# Patient Record
Sex: Female | Born: 1980 | Race: Black or African American | Hispanic: No | Marital: Married | Smoking: Never smoker
Health system: Southern US, Community
[De-identification: ages and names within clinical notes are randomized; demographics above are authoritative.]

## PROBLEM LIST (undated history)

## (undated) DIAGNOSIS — I10 Essential (primary) hypertension: Secondary | ICD-10-CM

## (undated) DIAGNOSIS — K219 Gastro-esophageal reflux disease without esophagitis: Secondary | ICD-10-CM

---

## 2004-03-29 ENCOUNTER — Ambulatory Visit (HOSPITAL_COMMUNITY): Admission: RE | Admit: 2004-03-29 | Discharge: 2004-03-29 | Payer: Self-pay | Admitting: *Deleted

## 2004-06-08 ENCOUNTER — Inpatient Hospital Stay (HOSPITAL_COMMUNITY): Admission: AD | Admit: 2004-06-08 | Discharge: 2004-06-08 | Payer: Self-pay | Admitting: Family Medicine

## 2004-07-05 ENCOUNTER — Ambulatory Visit (HOSPITAL_COMMUNITY): Admission: RE | Admit: 2004-07-05 | Discharge: 2004-07-05 | Payer: Self-pay | Admitting: Gynecology

## 2004-08-20 ENCOUNTER — Inpatient Hospital Stay (HOSPITAL_COMMUNITY): Admission: AD | Admit: 2004-08-20 | Discharge: 2004-08-20 | Payer: Self-pay | Admitting: *Deleted

## 2004-08-21 ENCOUNTER — Inpatient Hospital Stay (HOSPITAL_COMMUNITY): Admission: AD | Admit: 2004-08-21 | Discharge: 2004-08-23 | Payer: Self-pay | Admitting: Gynecology

## 2005-05-19 ENCOUNTER — Emergency Department: Payer: Self-pay | Admitting: Emergency Medicine

## 2006-06-27 ENCOUNTER — Emergency Department: Payer: Self-pay | Admitting: Emergency Medicine

## 2007-06-08 ENCOUNTER — Encounter: Payer: Self-pay | Admitting: Podiatry

## 2007-06-28 ENCOUNTER — Encounter: Payer: Self-pay | Admitting: Podiatry

## 2010-03-08 ENCOUNTER — Emergency Department: Payer: Self-pay | Admitting: Emergency Medicine

## 2011-05-13 IMAGING — CT CT HEAD WITHOUT CONTRAST
2 series · 16 of 30 positions shown, 20 images · non-contrast
Comparison: none

REASON FOR EXAM: headache
COMMENTS:

PROCEDURE:     CT  - CT HEAD WITHOUT CONTRAST  - March 09, 2010  [DATE]
RESULT:     Comparison:  None
TECHNIQUE: Multiple axial images from the foramen magnum to the vertex were
obtained without IV contrast.

[Series 2: without · axial · non-contrast · 0.44mm/px · z∈[-634,-504]mm · 13 of 32 slices shown, 17 images]
[im 3/32  brain]
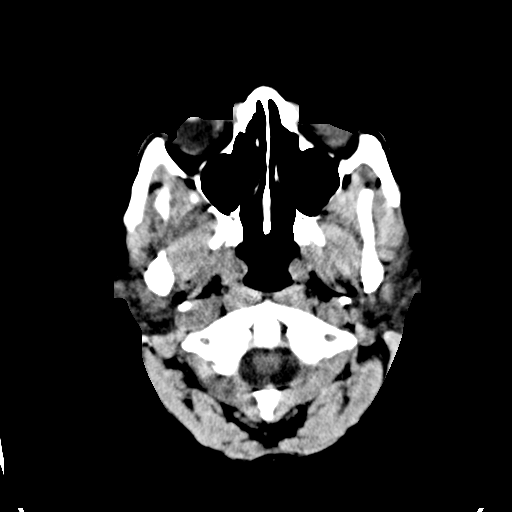
[im 3/32  bone]
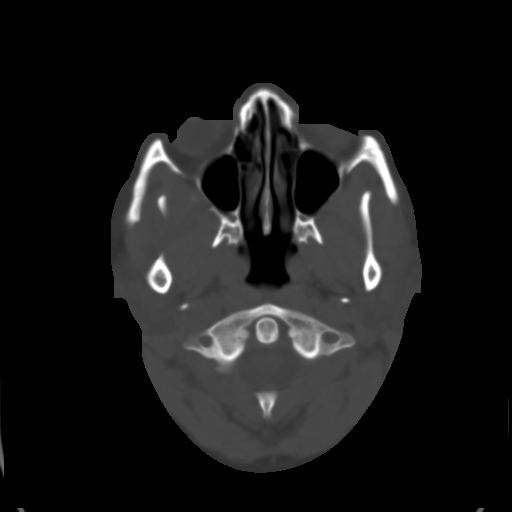
[im 5/32  brain]
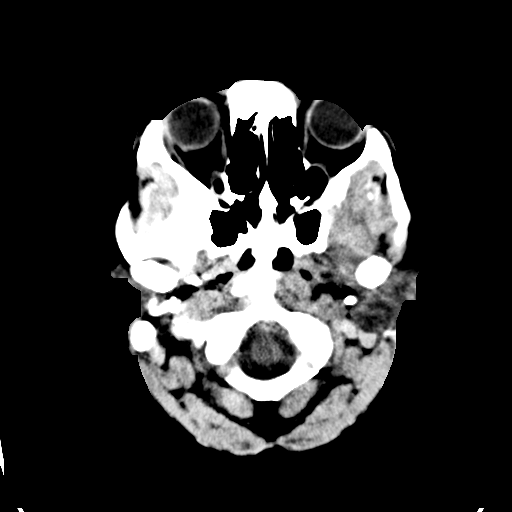
[im 7/32  brain]
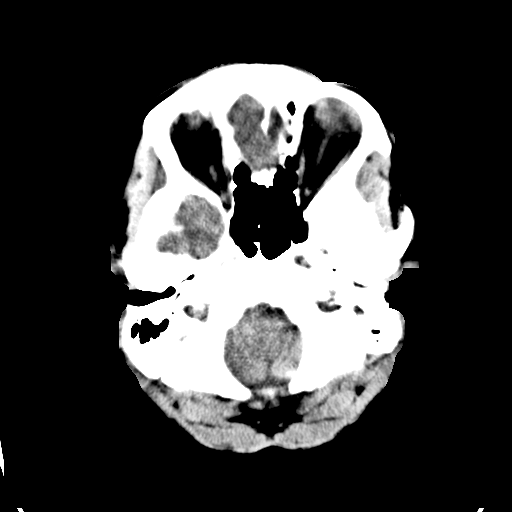
[im 9/32  brain]
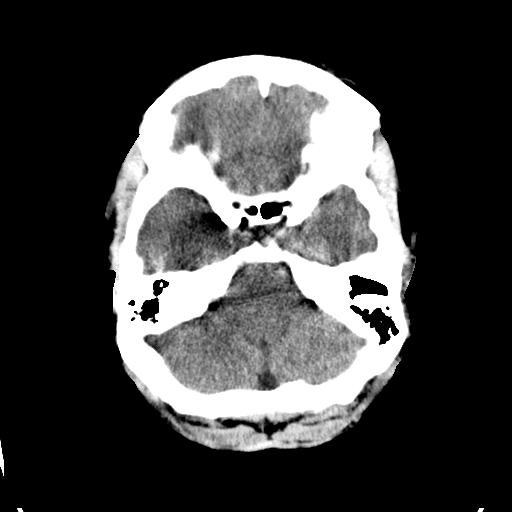
[im 12/32  brain]
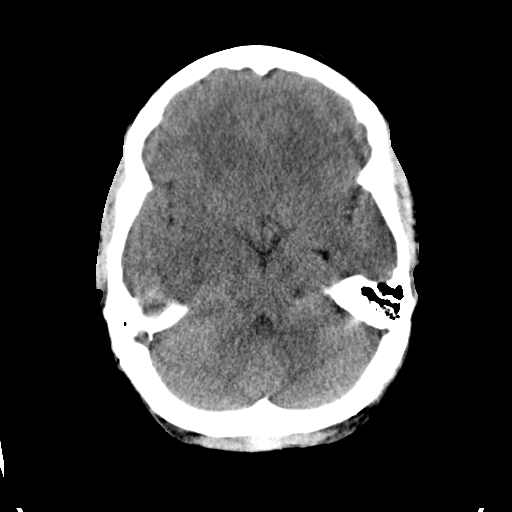
[im 12/32  bone]
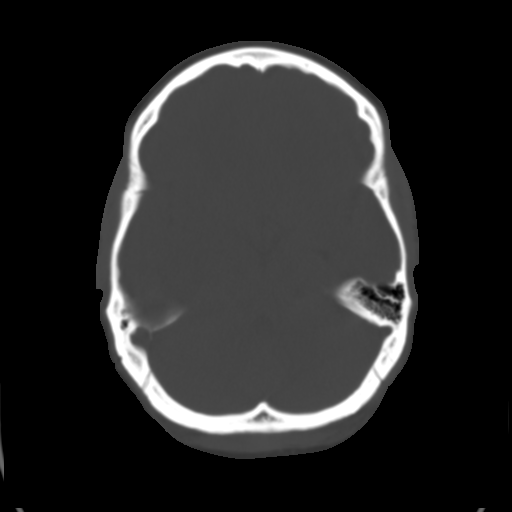
[im 14/32  brain]
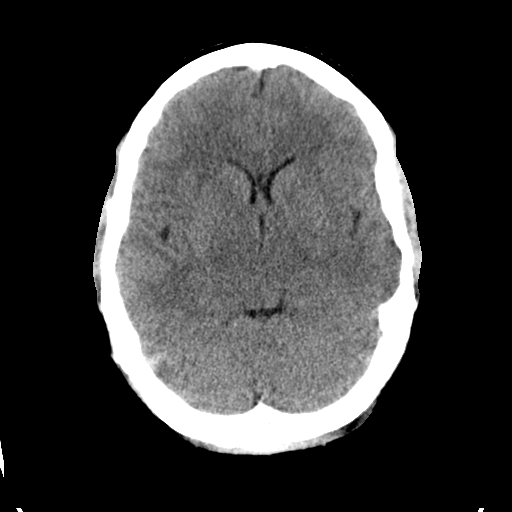
[im 16/32  brain]
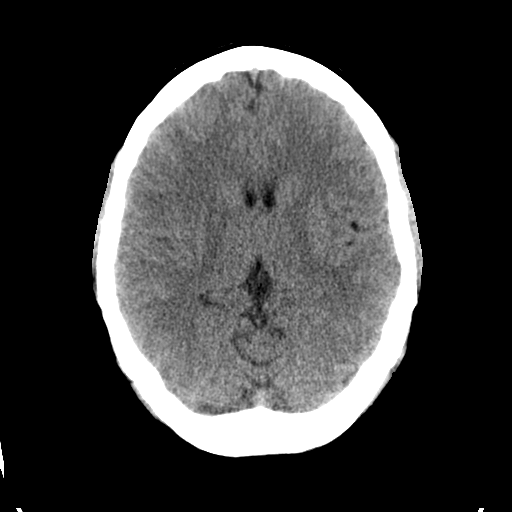
[im 18/32  brain]
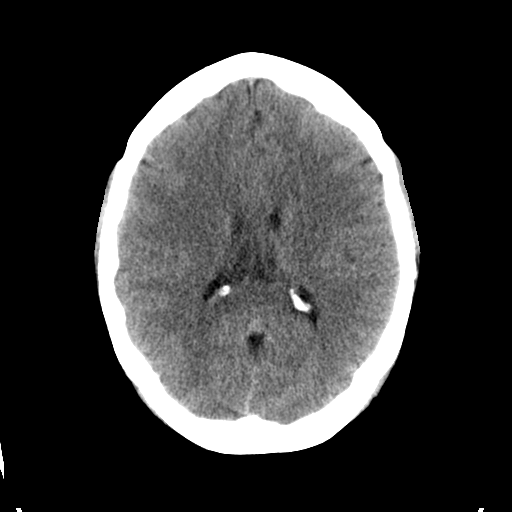
[im 20/32  brain]
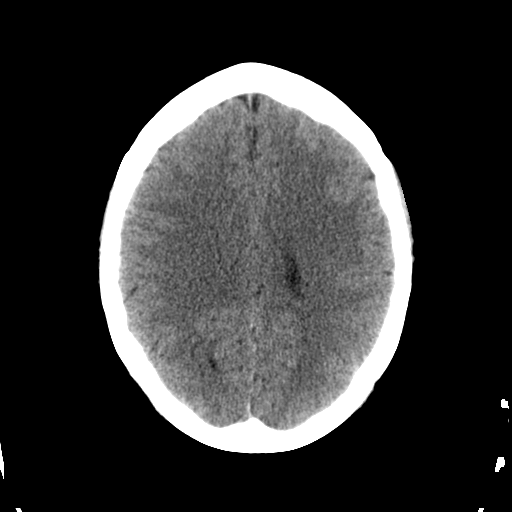
[im 20/32  bone]
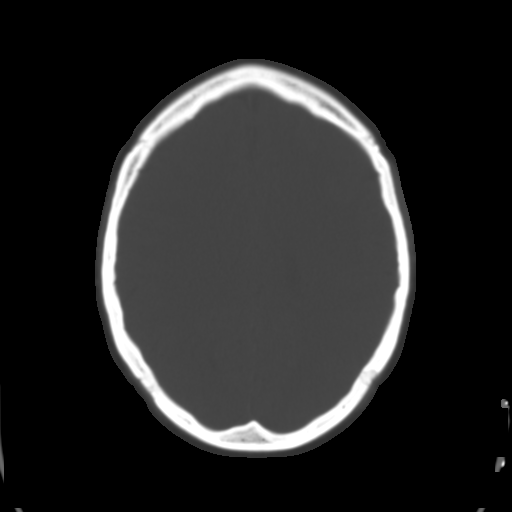
[im 23/32  brain]
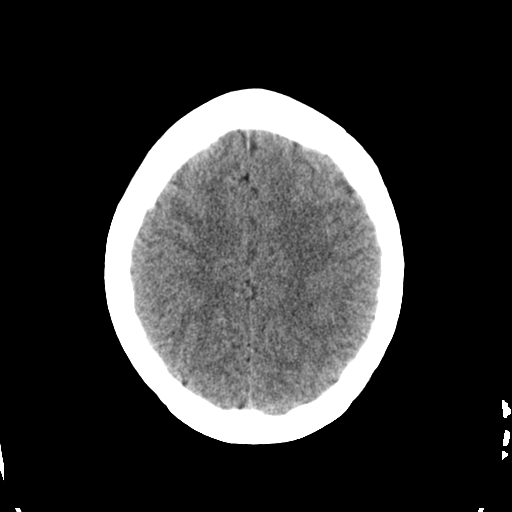
[im 25/32  brain]
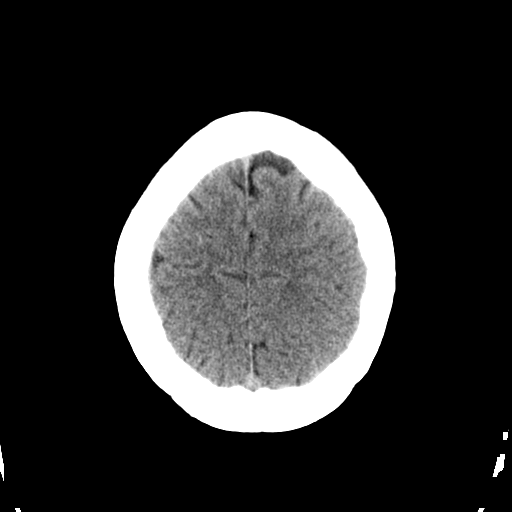
[im 27/32  brain]
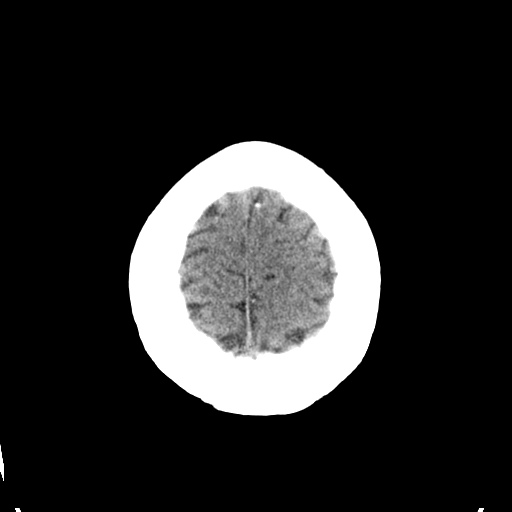
[im 29/32  brain]
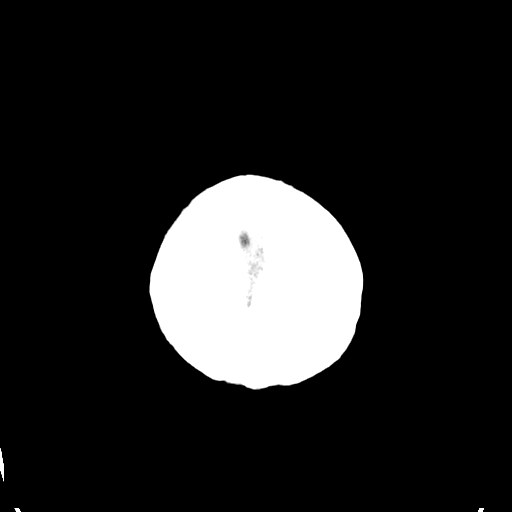
[im 29/32  bone]
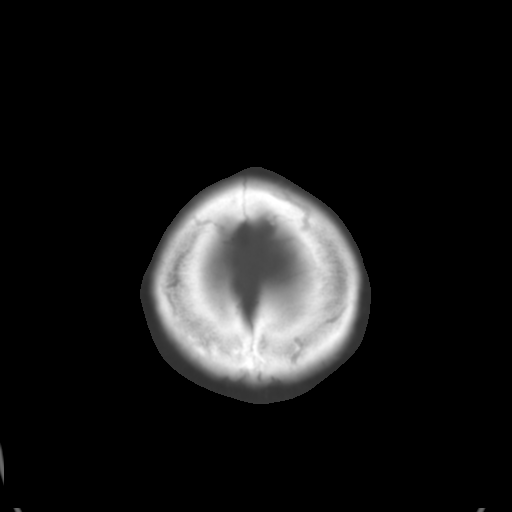

[Series 3: bone · axial · 0.44mm/px · z∈[-634,-588]mm · 3 of 32 slices shown]
[im 3/32  bone]
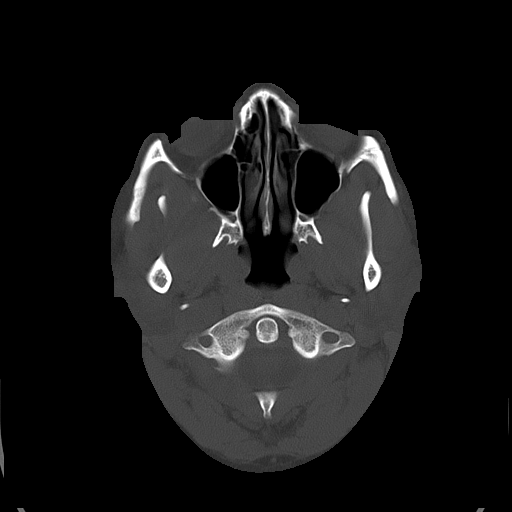
[im 7/32  bone]
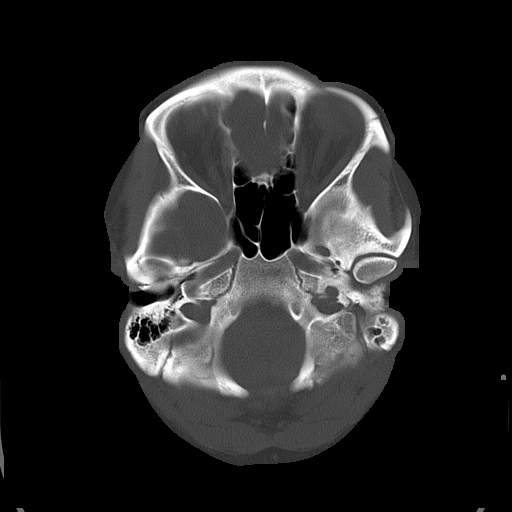
[im 12/32  bone]
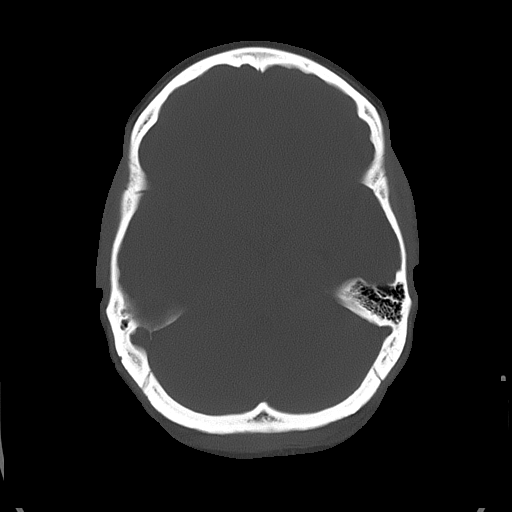

[16 of 30 positions shown; findings below may reference images not displayed]

FINDINGS: There is no evidence for mass effect, midline shift, or extra-axial fluid
collections. There is no evidence for space-occupying lesion, intracranial
hemorrhage, or cortical-based area of infarction.

The osseous structures are unremarkable.
IMPRESSION: No acute intracranial process.

## 2013-12-27 ENCOUNTER — Emergency Department: Payer: Self-pay | Admitting: Emergency Medicine

## 2013-12-27 LAB — BASIC METABOLIC PANEL
Anion Gap: 11 (ref 7–16)
BUN: 5 mg/dL — ABNORMAL LOW (ref 7–18)
Calcium, Total: 8.7 mg/dL (ref 8.5–10.1)
Chloride: 103 mmol/L (ref 98–107)
Co2: 24 mmol/L (ref 21–32)
Creatinine: 1.1 mg/dL (ref 0.60–1.30)
EGFR (African American): >60
EGFR (Non-African Amer.): >60
Glucose: 82 mg/dL (ref 65–99)
Osmolality: 272 (ref 275–301)
Potassium: 3.2 mmol/L — ABNORMAL LOW (ref 3.5–5.1)
Sodium: 138 mmol/L (ref 136–145)

## 2013-12-27 LAB — CBC
HCT: 39.2 % (ref 35.0–47.0)
HGB: 13.1 g/dL (ref 12.0–16.0)
MCH: 30 pg (ref 26.0–34.0)
MCHC: 33.3 g/dL (ref 32.0–36.0)
MCV: 90 fL (ref 80–100)
Platelet: 289 10*3/uL (ref 150–440)
RBC: 4.35 10*6/uL (ref 3.80–5.20)
RDW: 13.1 % (ref 11.5–14.5)
WBC: 8 10*3/uL (ref 3.6–11.0)

## 2013-12-27 LAB — TROPONIN I: Troponin-I: <0.02 ng/mL

## 2014-09-11 ENCOUNTER — Emergency Department: Payer: Self-pay | Admitting: Emergency Medicine

## 2014-10-08 ENCOUNTER — Ambulatory Visit
Admit: 2014-10-08 | Disposition: A | Discharge status: Home / Self Care | Payer: Self-pay | Attending: Chiropractor | Admitting: Chiropractor

## 2016-07-29 ENCOUNTER — Ambulatory Visit: Payer: BLUE CROSS/BLUE SHIELD | Attending: Neurology

## 2016-07-29 DIAGNOSIS — G4733 Obstructive sleep apnea (adult) (pediatric): Secondary | ICD-10-CM | POA: Diagnosis not present

## 2016-07-29 DIAGNOSIS — R0683 Snoring: Secondary | ICD-10-CM | POA: Diagnosis present

## 2019-05-27 ENCOUNTER — Other Ambulatory Visit: Payer: Self-pay

## 2019-05-27 DIAGNOSIS — Z20822 Contact with and (suspected) exposure to covid-19: Secondary | ICD-10-CM

## 2019-05-29 LAB — NOVEL CORONAVIRUS, NAA: SARS-CoV-2, NAA: NOT DETECTED

## 2019-11-22 ENCOUNTER — Ambulatory Visit: Payer: BLUE CROSS/BLUE SHIELD | Admitting: Cardiology

## 2019-11-28 ENCOUNTER — Encounter: Payer: Self-pay | Admitting: *Deleted

## 2019-11-29 ENCOUNTER — Ambulatory Visit: Payer: Self-pay | Admitting: Cardiology

## 2019-12-02 ENCOUNTER — Encounter: Payer: Self-pay | Admitting: Cardiology

## 2022-08-12 ENCOUNTER — Ambulatory Visit (INDEPENDENT_AMBULATORY_CARE_PROVIDER_SITE_OTHER): Payer: BC Managed Care – PPO | Admitting: Podiatry

## 2022-08-12 ENCOUNTER — Encounter: Payer: Self-pay | Admitting: Podiatry

## 2022-08-12 ENCOUNTER — Ambulatory Visit (INDEPENDENT_AMBULATORY_CARE_PROVIDER_SITE_OTHER): Payer: BC Managed Care – PPO

## 2022-08-12 VITALS — BP 109/64 | HR 77

## 2022-08-12 DIAGNOSIS — M25872 Other specified joint disorders, left ankle and foot: Secondary | ICD-10-CM | POA: Diagnosis not present

## 2022-08-12 DIAGNOSIS — Q666 Other congenital valgus deformities of feet: Secondary | ICD-10-CM | POA: Diagnosis not present

## 2022-08-12 DIAGNOSIS — M21619 Bunion of unspecified foot: Secondary | ICD-10-CM

## 2022-08-12 DIAGNOSIS — M21612 Bunion of left foot: Secondary | ICD-10-CM

## 2022-08-12 NOTE — Progress Notes (Signed)
Subjective:  Patient ID: Anita Knight, female    DOB: [birth year],  MRN: [id number]  Chief Complaint  Patient presents with   Foot Pain    "It's my left toe, the big one.  It hurts when I walk, throbs and sometimes it feels like it's burning.  My doctor told me from my MRI, that is is cracked.  It's been hurting since the beginning of last summer.  The doctor had started giving me cortisone shots but they didn't help."    42 y.o. female presents with the above complaint.  I explained to the patient the etiology of sesamoiditis and various treatment options were discussed.  She states that has been present for quite some time.  She has seen previous foot doctor gave her a steroid injection and got an MRI which shows some inflammation of the sesamoid bone.  She has tried all conservative care and would like to discuss surgical options at this time.  She had an MRI done at [place] of Systems: Negative except as noted in the HPI. Denies N/V/F/Ch.  No past medical history on file.  Current Outpatient Medications:    amLODipine (NORVASC) 10 MG tablet, Take 10 mg by mouth daily., Disp: , Rfl:    pantoprazole (PROTONIX) 20 MG tablet, Take 20 mg by mouth daily., Disp: , Rfl:   Social History   Tobacco Use  Smoking Status Never  Smokeless Tobacco Never    No Known Allergies Objective:   Vitals:   08/12/22 1155  BP: 109/64  Pulse: 77   There is no height or weight on file to calculate BMI. Constitutional Well developed. Well nourished.  Vascular Dorsalis pedis pulses palpable bilaterally. Posterior tibial pulses palpable bilaterally. Capillary refill normal to all digits.  No cyanosis or clubbing noted. Pedal hair growth normal.  Neurologic Normal speech. Oriented to person, place, and time. Epicritic sensation to light touch grossly present bilaterally.  Dermatologic Nails well groomed and normal in appearance. No open wounds. No skin lesions.   Orthopedic: Pain on palpation to the sesamoidal complex especially to the medial side.  Pain on palpation.  No pain with range of motion of the metatarsophalangeal joint no deep intra-articular pain noted.  No pain at the fibular sesamoid   Radiographs: 3 views of skeletally mature the left foot: Possible fracture noted at the medial sesamoid/tibial sesamoid.  Some arthritis noted the first metatarsophalangeal joint no other bony abnormalities noted   BONES / JOINTS:  - Moderate 1st MTP joint effusion with mild synovial enhancement.  - Edema and enhancement of the 1st medial sesamoid with linear increased signal intensity present within the proximal lateral aspect of the medial sesamoid possibly representing a small avulsion.  - Joint spaces are maintained.   Assessment:   1. Bunion    Plan:  Patient was evaluated and treated and all questions answered.  Left tibial sesamoiditis -All questions and concerns were discussed with the patient in extensive detail -Given that patient has failed all conservative care including padding protecting shoe gear modification I believe patient will benefit from sesamoidectomy.  I discussed that patient may have a bunion in the future due to removal of tibial sesamoid patient states understand would like to proceed with surgery -Informed surgical risk consent was reviewed and read aloud to the patient.  I reviewed the films.  I have discussed my findings with the patient in great detail.  I have discussed all risks including but not limited to infection,  stiffness, scarring, limp, disability, deformity, damage to blood vessels and nerves, numbness, poor healing, need for braces, arthritis, chronic pain, amputation, death.  All benefits and realistic expectations discussed in great detail.  I have made no promises as to the outcome.  I have provided realistic expectations.  I have offered the patient a 2nd opinion, which they have declined and assured me they  preferred to proceed despite the risks  Pes planovalgus -I explained to patient the etiology of pes planovalgus and relationship with myelitis and various treatment options were discussed.  Given patient foot structure in the setting of sesamoiditis I believe patient will benefit from custom-made orthotics to help control the hindfoot motion support the arch of the foot and take the stress away from plantar fascial.  Patient agrees with the plan like to proceed with orthotics -Patient was casted for orthotics

## 2022-08-19 ENCOUNTER — Telehealth: Payer: Self-pay | Admitting: Urology

## 2022-08-19 NOTE — Telephone Encounter (Signed)
DOS - 08/29/22  SESAMOIDECTOMY LEFT --- [id number]  BCBS STATE EFFECTIVE DATE 06/28/19 [employer] (SECONDARY)  SPOKE WITH [place] AND SHE STATED THAT FOR CPT CODE 40102 NO PRIOR AUTH REQUIRED.  REF # JALISA B. 08/19/22 AT 11:50 AM EST   SPOKE WITH VINCE W. WITH [employer] AN[employer]M AND HE STATED THAT FOR CPT CODE 72536 NO PRIOR AUTH IS REQUIRED DUE TO [employer] ANTHEM BEING HER SECONDARY INSURANCE.  REF # I [id number]

## 2022-08-22 ENCOUNTER — Telehealth: Payer: Self-pay | Admitting: Podiatry

## 2022-08-22 ENCOUNTER — Encounter: Payer: Self-pay | Admitting: Podiatry

## 2022-08-22 NOTE — Telephone Encounter (Signed)
Patient is scheduled for surgery on 3/4 and would like to go out of work early due to the pain and swelling in her foot. She wears steel toe shoes at work and is unable to wear her boot, so would like to take leave beginning 08/22/2022.

## 2022-08-29 ENCOUNTER — Other Ambulatory Visit: Payer: Self-pay | Admitting: Podiatry

## 2022-08-29 ENCOUNTER — Encounter: Payer: Self-pay | Admitting: Podiatry

## 2022-08-29 DIAGNOSIS — M21612 Bunion of left foot: Secondary | ICD-10-CM | POA: Diagnosis not present

## 2022-08-29 MED ORDER — IBUPROFEN 800 MG PO TABS: 800.0000 mg | ORAL_TABLET | Freq: Four times a day (QID) | ORAL | 1 refills | Status: AC | PRN

## 2022-08-29 MED ORDER — OXYCODONE-ACETAMINOPHEN 5-325 MG PO TABS: 1.0000 | ORAL_TABLET | ORAL | 0 refills | Status: AC | PRN

## 2022-09-08 ENCOUNTER — Encounter: Payer: Self-pay | Admitting: Podiatry

## 2022-09-08 ENCOUNTER — Ambulatory Visit (INDEPENDENT_AMBULATORY_CARE_PROVIDER_SITE_OTHER): Payer: BC Managed Care – PPO | Admitting: Podiatry

## 2022-09-08 ENCOUNTER — Ambulatory Visit (INDEPENDENT_AMBULATORY_CARE_PROVIDER_SITE_OTHER): Payer: BC Managed Care – PPO

## 2022-09-08 DIAGNOSIS — Z9889 Other specified postprocedural states: Secondary | ICD-10-CM

## 2022-09-08 DIAGNOSIS — M79676 Pain in unspecified toe(s): Secondary | ICD-10-CM

## 2022-09-08 DIAGNOSIS — M25872 Other specified joint disorders, left ankle and foot: Secondary | ICD-10-CM

## 2022-09-08 MED ORDER — HYDROCODONE-ACETAMINOPHEN 5-325 MG PO TABS: 1.0000 | ORAL_TABLET | Freq: Four times a day (QID) | ORAL | 0 refills | Status: AC | PRN

## 2022-09-08 NOTE — Progress Notes (Signed)
  Subjective:  Patient ID: Anita Knight, female    DOB: Apr 18, 1981,  MRN: [id number]  Chief Complaint  Patient presents with   Routine Post Op    "It's hurting.  The Oxycodone makes me itch."    DOS: 08/29/2022 Procedure: Left sesamoidectomy  42 y.o. female returns for post-op check.  Patient states that she is doing well she is having this pain.  Oxycodone she was not able to take because of itching.  She wanted to get it evaluated she denies any other acute complaints.  Review of Systems: Negative except as noted in the HPI. Denies N/V/F/Ch.  No past medical history on file.  Current Outpatient Medications:    amLODipine (NORVASC) 10 MG tablet, Take 10 mg by mouth daily., Disp: , Rfl:    ibuprofen (ADVIL) 800 MG tablet, Take 1 tablet (800 mg total) by mouth every 6 (six) hours as needed., Disp: 60 tablet, Rfl: 1   pantoprazole (PROTONIX) 20 MG tablet, Take 20 mg by mouth daily., Disp: , Rfl:    oxyCODONE-acetaminophen (PERCOCET) 5-325 MG tablet, Take 1 tablet by mouth every 4 (four) hours as needed for severe pain. (Patient not taking: Reported on 09/08/2022), Disp: 30 tablet, Rfl: 0  Social History   Tobacco Use  Smoking Status Never  Smokeless Tobacco Never    No Known Allergies Objective:  There were no vitals filed for this visit. There is no height or weight on file to calculate BMI. Constitutional Well developed. Well nourished.  Vascular Foot warm and well perfused. Capillary refill normal to all digits.   Neurologic Normal speech. Oriented to person, place, and time. Epicritic sensation to light touch grossly present bilaterally.  Dermatologic Skin healing well without signs of infection. Skin edges well coapted without signs of infection.  Orthopedic: Tenderness to palpation noted about the surgical site.   Radiographs: 3 views of skeletally mature adult left foot: Tibial sesamoid is removed.  Intact fibular sesamoid Assessment:   1. Status post  surgery    Plan:  Patient was evaluated and treated and all questions answered.  S/p foot surgery left -Progressing as expected post-operatively. -XR: See above -WB Status: Weightbearing as tolerated in surgical shoe -Sutures: Intact.  No signs of Deis is noted no complication noted. -Medications: Hydrocodone -Foot redressed.  No follow-ups on file.

## 2022-09-12 ENCOUNTER — Telehealth: Payer: Self-pay

## 2022-09-12 NOTE — Telephone Encounter (Signed)
Ulah came and picked the letter up today.

## 2022-09-20 ENCOUNTER — Ambulatory Visit (INDEPENDENT_AMBULATORY_CARE_PROVIDER_SITE_OTHER): Payer: BC Managed Care – PPO | Admitting: Podiatry

## 2022-09-20 DIAGNOSIS — Z9889 Other specified postprocedural states: Secondary | ICD-10-CM

## 2022-09-20 NOTE — Progress Notes (Signed)
  Subjective:  Patient ID: Anita Knight, female    DOB: 1980-07-03,  MRN: [id number]  No chief complaint on file.   DOS: 08/29/2022 Procedure: Left sesamoidectomy  42 y.o. female returns for post-op check.  Patient states she is doing well she still having some pain with achiness.  But it is improving slowly denies any other acute complaints Review of Systems: Negative except as noted in the HPI. Denies N/V/F/Ch.  No past medical history on file.  Current Outpatient Medications:    amLODipine (NORVASC) 10 MG tablet, Take 10 mg by mouth daily., Disp: , Rfl:    HYDROcodone-acetaminophen (NORCO) 5-325 MG tablet, Take 1 tablet by mouth every 6 (six) hours as needed for moderate pain., Disp: 30 tablet, Rfl: 0   ibuprofen (ADVIL) 800 MG tablet, Take 1 tablet (800 mg total) by mouth every 6 (six) hours as needed., Disp: 60 tablet, Rfl: 1   oxyCODONE-acetaminophen (PERCOCET) 5-325 MG tablet, Take 1 tablet by mouth every 4 (four) hours as needed for severe pain. (Patient not taking: Reported on 09/08/2022), Disp: 30 tablet, Rfl: 0   pantoprazole (PROTONIX) 20 MG tablet, Take 20 mg by mouth daily., Disp: , Rfl:   Social History   Tobacco Use  Smoking Status Never  Smokeless Tobacco Never    No Known Allergies Objective:  There were no vitals filed for this visit. There is no height or weight on file to calculate BMI. Constitutional Well developed. Well nourished.  Vascular Foot warm and well perfused. Capillary refill normal to all digits.   Neurologic Normal speech. Oriented to person, place, and time. Epicritic sensation to light touch grossly present bilaterally.  Dermatologic Skin healing well without signs of infection. Skin edges well coapted without signs of infection.  Orthopedic: Mild tenderness to palpation noted about the surgical site.   Radiographs: 3 views of skeletally mature adult left foot: Tibial sesamoid is removed.  Intact fibular sesamoid Assessment:    No diagnosis found.  Plan:  Patient was evaluated and treated and all questions answered.  S/p foot surgery left -Progressing as expected post-operatively. -XR: See above -WB Status: Weightbearing as tolerated in surgical shoe -Sutures: Removed no signs of Deis is noted no complication noted. -Medications: None -We will extend out the disability for another 2 weeks to help her get situated with her pain and to see if she is able to wear steel toe boots  No follow-ups on file.

## 2022-09-26 ENCOUNTER — Emergency Department: Payer: BC Managed Care – PPO

## 2022-09-26 ENCOUNTER — Emergency Department
Admission: EM | Admit: 2022-09-26 | Discharge: 2022-09-26 | Disposition: A | Discharge status: Home / Self Care | Payer: BC Managed Care – PPO | Attending: Emergency Medicine | Admitting: Emergency Medicine

## 2022-09-26 ENCOUNTER — Other Ambulatory Visit: Payer: Self-pay

## 2022-09-26 DIAGNOSIS — R002 Palpitations: Secondary | ICD-10-CM | POA: Diagnosis not present

## 2022-09-26 DIAGNOSIS — R000 Tachycardia, unspecified: Secondary | ICD-10-CM

## 2022-09-26 HISTORY — DX: Essential (primary) hypertension: I10

## 2022-09-26 HISTORY — DX: Gastro-esophageal reflux disease without esophagitis: K21.9

## 2022-09-26 LAB — URINALYSIS, ROUTINE W REFLEX MICROSCOPIC
Bilirubin Urine: NEGATIVE
Glucose, UA: NEGATIVE mg/dL
Hgb urine dipstick: NEGATIVE
Ketones, ur: NEGATIVE mg/dL
Leukocytes,Ua: NEGATIVE
Nitrite: NEGATIVE
Protein, ur: NEGATIVE mg/dL
Specific Gravity, Urine: 1.019 (ref 1.005–1.030)
pH: 6 (ref 5.0–8.0)

## 2022-09-26 LAB — BASIC METABOLIC PANEL
Anion gap: 10 (ref 5–15)
BUN: 11 mg/dL (ref 6–20)
CO2: 22 mmol/L (ref 22–32)
Calcium: 8.8 mg/dL — ABNORMAL LOW (ref 8.9–10.3)
Chloride: 102 mmol/L (ref 98–111)
Creatinine, Ser: 1 mg/dL (ref 0.44–1.00)
GFR, Estimated: >60 mL/min (ref >60)
Glucose, Bld: 180 mg/dL — ABNORMAL HIGH (ref 70–99)
Potassium: 3.6 mmol/L (ref 3.5–5.1)
Sodium: 134 mmol/L — ABNORMAL LOW (ref 135–145)

## 2022-09-26 LAB — T4, FREE: Free T4: 0.71 ng/dL (ref 0.61–1.12)

## 2022-09-26 LAB — HEPATIC FUNCTION PANEL
ALT: 14 U/L (ref 0–44)
AST: 27 U/L (ref 15–41)
Albumin: 3.3 g/dL — ABNORMAL LOW (ref 3.5–5.0)
Alkaline Phosphatase: 72 U/L (ref 38–126)
Bilirubin, Direct: 0.3 mg/dL — ABNORMAL HIGH (ref 0.0–0.2)
Indirect Bilirubin: 0.8 mg/dL (ref 0.3–0.9)
Total Bilirubin: 1.1 mg/dL (ref 0.3–1.2)
Total Protein: 7.2 g/dL (ref 6.5–8.1)

## 2022-09-26 LAB — D-DIMER, QUANTITATIVE: D-Dimer, Quant: 0.29 ug/mL-FEU (ref 0.00–0.50)

## 2022-09-26 LAB — CBC
HCT: 41.5 % (ref 36.0–46.0)
Hemoglobin: 13.7 g/dL (ref 12.0–15.0)
MCH: 29.5 pg (ref 26.0–34.0)
MCHC: 33 g/dL (ref 30.0–36.0)
MCV: 89.4 fL (ref 80.0–100.0)
Platelets: 286 10*3/uL (ref 150–400)
RBC: 4.64 MIL/uL (ref 3.87–5.11)
RDW: 13.2 % (ref 11.5–15.5)
WBC: 7.2 10*3/uL (ref 4.0–10.5)
nRBC: 0 % (ref 0.0–0.2)

## 2022-09-26 LAB — TROPONIN I (HIGH SENSITIVITY)
Troponin I (High Sensitivity): 3 ng/L
Troponin I (High Sensitivity): 5 ng/L

## 2022-09-26 LAB — TSH: TSH: 4.206 u[IU]/mL (ref 0.350–4.500)

## 2022-09-26 LAB — POC URINE PREG, ED: Preg Test, Ur: NEGATIVE

## 2022-09-26 MED ORDER — LORAZEPAM 2 MG/ML IJ SOLN
0.5000 mg | Freq: Once | INTRAMUSCULAR | Status: AC
  Administered 2022-09-26: 0.5 mg via INTRAVENOUS
  Filled 2022-09-26: qty 1

## 2022-09-26 MED ORDER — SODIUM CHLORIDE 0.9 % IV BOLUS
1000.0000 mL | Freq: Once | INTRAVENOUS | Status: AC
  Administered 2022-09-26: 1000 mL via INTRAVENOUS

## 2022-09-26 NOTE — ED Provider Notes (Addendum)
[facility] Provider Note    Event Date/Time   First MD Initiated Contact with Patient 09/26/22 1436     (approximate)   History   Tachycardia   HPI  Anita Knight is a 42 y.o. female with history of toe surgery and being on March comes in with tachycardia.  Patient reports that she was at school today when her she started to feel unwell with palpitations.  When EMS that got there her heart rates were in the 140s sinus and improved to 119.  She denies any chest pain, shortness of breath to me however in the EMS triage note she had reported some left upper chest pain that is since subsided.  When I ask her about it she reports that is more like a palpitation than a true chest pain.  Again she reports that this is since resolved- she does report having multiple caffeine today including a Starbucks drink, soft drink an energy drink.  She also reports taking a new vitamin Ashwagandha for the first time and not sure if she reacted to that.  She reports feeling anxious but denies any SI.  She denies any current chest pain, shortness of breath- Denies any h/o blood clots. Does have nexplanon. No coughing up blood.   Physical Exam   Triage Vital Signs: ED Triage Vitals  Enc Vitals Group     BP 09/26/22 1400 129/77     Pulse Rate 09/26/22 1400 (!) 103     Resp 09/26/22 1400 18     Temp 09/26/22 1400 98 F (36.7 C)     Temp Source 09/26/22 1400 Oral     SpO2 09/26/22 1400 99 %     Weight --      Height --      Head Circumference --      Peak Flow --      Pain Score 09/26/22 1358 0     Pain Loc --      Pain Edu? --      Excl. in [place]? --     Most recent vital signs: Vitals:   09/26/22 1420 09/26/22 1430  BP: 101/68 106/83  Pulse: 84 85  Resp: 18 18  Temp:    SpO2: 96% 98%     General: Awake, no distress.  CV:  Good peripheral perfusion.  Resp:  Normal effort.  Abd:  No distention.  Soft nontender Other:  No swelling in legs.  No calf  tenderness Patient states multiple times that she feels very anxious.  But denies any SI  ED Results / Procedures / Treatments   Labs (all labs ordered are listed, but only abnormal results are displayed) Labs Reviewed  CBC  BASIC METABOLIC PANEL  D-DIMER, QUANTITATIVE  TSH  T4, FREE  HEPATIC FUNCTION PANEL  URINALYSIS, ROUTINE W REFLEX MICROSCOPIC  POC URINE PREG, ED  TROPONIN I (HIGH SENSITIVITY)     EKG  My interpretation of EKG:  Sinus tachycardia rate of 108 without any ST elevation but T wave inversion in inferior lateral leads, normal intervals.  No prior EKG for 9 years  RADIOLOGY Pending   PROCEDURES:  Critical Care performed: No  .1-3 Lead EKG Interpretation  Performed by: Vanessa [place], MD Authorized by: Vanessa , MD     Interpretation: abnormal     ECG rate:  100   ECG rate assessment: tachycardic     Rhythm: sinus tachycardia     Ectopy: none  Conduction: normal      MEDICATIONS ORDERED IN ED: Medications  sodium chloride 0.9 % bolus 1,000 mL (has no administration in time range)  LORazepam (ATIVAN) injection 0.5 mg (has no administration in time range)     IMPRESSION / MDM / ASSESSMENT AND PLAN / ED COURSE  I reviewed the triage vital signs and the nursing notes.   Patient's presentation is most consistent with acute presentation with potential threat to life or bodily function.   Patient comes in with tachycardia and some chest pain EKG with T wave inversions but no prior EKG for over 9 years.  Patient's well score is 3-we discussed D-dimer versus CT PE but given that she has no chest pain or shortness of breath no palpitations were only brief she would like to proceed with D-dimer.  Will get cardiac markers to evaluate for ACS, thyroid testing, electrolyte testing.  Patient will be given some fluids, Ativan given she reports feeling anxious.  CBC reassuring.  Heart rates have come down  Patient handed off oncoming team pending  labs, will need repeat troponin given onset of pain and repeat evaluation.    The patient is on the cardiac monitor to evaluate for evidence of arrhythmia and/or significant heart rate changes.      FINAL CLINICAL IMPRESSION(S) / ED DIAGNOSES   Final diagnoses:  Palpitations     Rx / DC Orders   ED Discharge Orders          Ordered    Ambulatory referral to Cardiology        09/26/22 1454             Note:  This document was prepared using Dragon voice recognition software and may include unintentional dictation errors.   Vanessa [place], MD 09/26/22 1456    Vanessa [place], MD 09/26/22 [phone]

## 2022-09-26 NOTE — Discharge Instructions (Addendum)
Your workup was reassuring and I would recommend not using the vitamin Ashwagandha in that case this could have been a reaction to it as well as decreased amount of caffeine that you are using and try to avoid it completely if possible.  I have however referred you to cardiology given your EKG is slightly abnormal to discuss a Holter monitor for your palpitations to make sure you have no evidence of any arrhythmias.  If you develop any recurrent chest pain, shortness of breath palpitations please return to the ER for repeat evaluation

## 2022-09-26 NOTE — ED Notes (Signed)
Patient to xray at this time

## 2022-09-26 NOTE — ED Triage Notes (Addendum)
Pt to ED via [facility] from class. Pt reports she was in class and started feeling weird. Pt called EMS. EMS states initial HR 140s and gave 500NS. Pt HR decreased to 119. Pt reports was having a slight discomfort in left upper chest but has subsided. EMS states several different sources of caffeine today (starbucks, soft drink and energy drink). Pt reports hx HTN and GERD.   EMS VS:  BP 124/74 97% RA HR 119

## 2022-09-26 NOTE — ED Notes (Signed)
First Nurse Note: Pt to ED via [facility] [place] the side of the road for rapid heart rate. Pts HR was in the 140's with EMS initially. Pt given fluid with improvement in HR to 119. Pt is in NAD.

## 2022-09-26 NOTE — ED Provider Notes (Signed)
-----------------------------------------   7:25 PM on 09/26/2022 -----------------------------------------   I took over care of the patient from Dr. Jari Pigg.  Lab workup is unremarkable.  Troponins are negative x 2.  D-dimer is negative.  Thyroid labs are within normal limits.  On reassessment the patient remains in sinus rhythm in the 80s and is asymptomatic.  At this time, the patient is stable for discharge home.  I counseled her on the results of the workup and on return precautions; she expressed understanding.   Arta Silence, MD 09/26/22 [phone]

## 2022-09-27 NOTE — Telephone Encounter (Signed)
Tried to reach patient again for orthotics no answer and no vm  .

## 2022-10-04 ENCOUNTER — Encounter: Payer: Self-pay | Admitting: Podiatry

## 2022-12-14 ENCOUNTER — Encounter (HOSPITAL_COMMUNITY): Payer: Self-pay

## 2022-12-14 ENCOUNTER — Emergency Department (HOSPITAL_COMMUNITY)
Admission: EM | Admit: 2022-12-14 | Discharge: 2022-12-14 | Disposition: A | Discharge status: Home / Self Care | Payer: BC Managed Care – PPO | Attending: Emergency Medicine | Admitting: Emergency Medicine

## 2022-12-14 ENCOUNTER — Emergency Department (HOSPITAL_COMMUNITY): Payer: BC Managed Care – PPO

## 2022-12-14 ENCOUNTER — Other Ambulatory Visit: Payer: Self-pay

## 2022-12-14 ENCOUNTER — Ambulatory Visit (HOSPITAL_COMMUNITY)
Admission: EM | Admit: 2022-12-14 | Discharge: 2022-12-14 | Disposition: A | Discharge status: Home / Self Care | Payer: BC Managed Care – PPO | Attending: Family Medicine | Admitting: Family Medicine

## 2022-12-14 DIAGNOSIS — I10 Essential (primary) hypertension: Secondary | ICD-10-CM | POA: Insufficient documentation

## 2022-12-14 DIAGNOSIS — R1031 Right lower quadrant pain: Secondary | ICD-10-CM | POA: Diagnosis present

## 2022-12-14 DIAGNOSIS — K769 Liver disease, unspecified: Secondary | ICD-10-CM | POA: Diagnosis not present

## 2022-12-14 DIAGNOSIS — N132 Hydronephrosis with renal and ureteral calculous obstruction: Secondary | ICD-10-CM | POA: Diagnosis not present

## 2022-12-14 DIAGNOSIS — Z79899 Other long term (current) drug therapy: Secondary | ICD-10-CM | POA: Insufficient documentation

## 2022-12-14 DIAGNOSIS — N200 Calculus of kidney: Secondary | ICD-10-CM

## 2022-12-14 LAB — COMPREHENSIVE METABOLIC PANEL
ALT: 26 U/L (ref 0–44)
AST: 24 U/L (ref 15–41)
Albumin: 3.8 g/dL (ref 3.5–5.0)
Alkaline Phosphatase: 74 U/L (ref 38–126)
Anion gap: 9 (ref 5–15)
BUN: 10 mg/dL (ref 6–20)
CO2: 22 mmol/L (ref 22–32)
Calcium: 9 mg/dL (ref 8.9–10.3)
Chloride: 105 mmol/L (ref 98–111)
Creatinine, Ser: 1.13 mg/dL — ABNORMAL HIGH (ref 0.44–1.00)
GFR, Estimated: >60 mL/min (ref >60)
Glucose, Bld: 113 mg/dL — ABNORMAL HIGH (ref 70–99)
Potassium: 3.5 mmol/L (ref 3.5–5.1)
Sodium: 136 mmol/L (ref 135–145)
Total Bilirubin: 1.1 mg/dL (ref 0.3–1.2)
Total Protein: 7.7 g/dL (ref 6.5–8.1)

## 2022-12-14 LAB — POCT URINALYSIS DIP (MANUAL ENTRY)
Bilirubin, UA: NEGATIVE
Glucose, UA: NEGATIVE mg/dL
Ketones, POC UA: NEGATIVE mg/dL
Leukocytes, UA: NEGATIVE
Nitrite, UA: NEGATIVE
Protein Ur, POC: NEGATIVE mg/dL
Spec Grav, UA: 1.02 (ref 1.010–1.025)
Urobilinogen, UA: 0.2 E.U./dL
pH, UA: 5.5 (ref 5.0–8.0)

## 2022-12-14 LAB — CBC WITH DIFFERENTIAL/PLATELET
Abs Immature Granulocytes: 0.01 10*3/uL (ref 0.00–0.07)
Basophils Absolute: 0 10*3/uL (ref 0.0–0.1)
Basophils Relative: 1 %
Eosinophils Absolute: 0.1 10*3/uL (ref 0.0–0.5)
Eosinophils Relative: 1 %
HCT: 40.7 % (ref 36.0–46.0)
Hemoglobin: 13.3 g/dL (ref 12.0–15.0)
Immature Granulocytes: 0 %
Lymphocytes Relative: 28 %
Lymphs Abs: 2.4 10*3/uL (ref 0.7–4.0)
MCH: 30.2 pg (ref 26.0–34.0)
MCHC: 32.7 g/dL (ref 30.0–36.0)
MCV: 92.3 fL (ref 80.0–100.0)
Monocytes Absolute: 0.7 10*3/uL (ref 0.1–1.0)
Monocytes Relative: 8 %
Neutro Abs: 5.4 10*3/uL (ref 1.7–7.7)
Neutrophils Relative %: 62 %
Platelets: 296 10*3/uL (ref 150–400)
RBC: 4.41 MIL/uL (ref 3.87–5.11)
RDW: 13.9 % (ref 11.5–15.5)
WBC: 8.6 10*3/uL (ref 4.0–10.5)
nRBC: 0 % (ref 0.0–0.2)

## 2022-12-14 LAB — URINALYSIS, W/ REFLEX TO CULTURE (INFECTION SUSPECTED)
Bilirubin Urine: NEGATIVE
Glucose, UA: NEGATIVE mg/dL
Ketones, ur: NEGATIVE mg/dL
Leukocytes,Ua: NEGATIVE
Nitrite: NEGATIVE
Protein, ur: NEGATIVE mg/dL
Specific Gravity, Urine: 1.013 (ref 1.005–1.030)
pH: 5 (ref 5.0–8.0)

## 2022-12-14 LAB — HCG, QUANTITATIVE, PREGNANCY: hCG, Beta Chain, Quant, S: <1 m[IU]/mL (ref <5)

## 2022-12-14 LAB — LIPASE, BLOOD: Lipase: 45 U/L (ref 11–51)

## 2022-12-14 MED ORDER — TAMSULOSIN HCL 0.4 MG PO CAPS
0.4000 mg | ORAL_CAPSULE | Freq: Once | ORAL | Status: AC
  Administered 2022-12-14: 0.4 mg via ORAL
  Filled 2022-12-14: qty 1

## 2022-12-14 MED ORDER — KETOROLAC TROMETHAMINE 10 MG PO TABS: 10.0000 mg | ORAL_TABLET | Freq: Four times a day (QID) | ORAL | 0 refills | Status: AC | PRN

## 2022-12-14 MED ORDER — ONDANSETRON HCL 4 MG/2ML IJ SOLN
4.0000 mg | Freq: Once | INTRAMUSCULAR | Status: AC
  Administered 2022-12-14: 4 mg via INTRAVENOUS
  Filled 2022-12-14: qty 2

## 2022-12-14 MED ORDER — KETOROLAC TROMETHAMINE 30 MG/ML IJ SOLN
30.0000 mg | Freq: Once | INTRAMUSCULAR | Status: AC
  Administered 2022-12-14: 30 mg via INTRAVENOUS
  Filled 2022-12-14: qty 1

## 2022-12-14 MED ORDER — TAMSULOSIN HCL 0.4 MG PO CAPS: 0.4000 mg | ORAL_CAPSULE | Freq: Every day | ORAL | 0 refills | Status: AC

## 2022-12-14 MED ORDER — ONDANSETRON 4 MG PO TBDP: 4.0000 mg | ORAL_TABLET | Freq: Three times a day (TID) | ORAL | 0 refills | Status: AC | PRN

## 2022-12-14 MED ORDER — IOHEXOL 300 MG/ML  SOLN
100.0000 mL | Freq: Once | INTRAMUSCULAR | Status: AC | PRN
  Administered 2022-12-14: 100 mL via INTRAVENOUS

## 2022-12-14 NOTE — ED Triage Notes (Signed)
RLQ pain that radiated to right back that started this morning

## 2022-12-14 NOTE — ED Provider Notes (Signed)
[facility]    CSN: [id number] Arrival date & time: 12/14/22  1354      History   Chief Complaint Chief Complaint  Patient presents with   Abdominal Pain    HPI Anita Knight is a 42 y.o. female.  Here with RLQ pain that started this morning. Woke her from sleep. Rates pain 10/10. Radiates to the right low back No nausea or vomiting Had soft stool this morning. Denies urinary symptoms  No fevers  Denies history of abd pain. No hx ovarian cysts/uterine issues Hx reflux   Past Medical History:  Diagnosis Date   GERD (gastroesophageal reflux disease)    Hypertension     There are no problems to display for this patient.   History reviewed. No pertinent surgical history.  OB History   No obstetric history on file.      Home Medications    Prior to Admission medications   Medication Sig Start Date End Date Taking? Authorizing Provider  amLODipine (NORVASC) 10 MG tablet Take 10 mg by mouth daily.   Yes [provider]  pantoprazole (PROTONIX) 20 MG tablet Take 20 mg by mouth daily.   Yes [provider]  HYDROcodone-acetaminophen (NORCO) 5-325 MG tablet Take 1 tablet by mouth every 6 (six) hours as needed for moderate pain. 09/08/22   Candelaria Stagers, DPM  ibuprofen (ADVIL) 800 MG tablet Take 1 tablet (800 mg total) by mouth every 6 (six) hours as needed. 08/29/22   Candelaria Stagers, DPM  oxyCODONE-acetaminophen (PERCOCET) 5-325 MG tablet Take 1 tablet by mouth every 4 (four) hours as needed for severe pain. Patient not taking: Reported on 09/08/2022 08/29/22   Candelaria Stagers, DPM    Family History History reviewed. No pertinent family history.  Social History Social History   Tobacco Use   Smoking status: Never   Smokeless tobacco: Never  Substance Use Topics   Alcohol use: Not Currently   Drug use: Never     Allergies   Hydrocodone   Review of Systems Review of Systems  Gastrointestinal:  Positive for abdominal  pain.   As per HPI  Physical Exam Triage Vital Signs ED Triage Vitals [12/14/22 1421]  Enc Vitals Group     BP 113/79     Pulse Rate 86     Resp 18     Temp 98.1 F (36.7 C)     Temp Source Oral     SpO2 96 %     Weight 272 lb (123.4 kg)     Height 5\' 5"  (1.651 m)     Head Circumference      Peak Flow      Pain Score 10     Pain Loc      Pain Edu?      Excl. in [place]?    No data found.  Updated Vital Signs BP 113/79 (BP Location: Left Arm)   Pulse 86   Temp 98.1 F (36.7 C) (Oral)   Resp 18   Ht 5\' 5"  (1.651 m)   Wt 272 lb (123.4 kg)   SpO2 96%   BMI 45.26 kg/m    Physical Exam Vitals and nursing note reviewed.  Constitutional:      Appearance: Normal appearance.  HENT:     Mouth/Throat:     Mouth: Mucous membranes are moist.     Pharynx: Oropharynx is clear.  Eyes:     Conjunctiva/sclera: Conjunctivae normal.  Cardiovascular:     Rate  and Rhythm: Normal rate and regular rhythm.     Heart sounds: Normal heart sounds.  Pulmonary:     Effort: Pulmonary effort is normal. No respiratory distress.     Breath sounds: Normal breath sounds.  Abdominal:     General: Bowel sounds are normal.     Palpations: Abdomen is soft.     Tenderness: There is abdominal tenderness in the right lower quadrant. There is no right CVA tenderness, left CVA tenderness, guarding or rebound.     Comments: Habitus limites exam. Patient appears uncomfortable. Tender in the RLQ  Musculoskeletal:        General: Normal range of motion.  Skin:    General: Skin is warm and dry.  Neurological:     Mental Status: She is alert and oriented to person, place, and time.     UC Treatments / Results  Labs (all labs ordered are listed, but only abnormal results are displayed) Labs Reviewed  POCT URINALYSIS DIP (MANUAL ENTRY) - Abnormal; Notable for the following components:      Result Value   Blood, UA moderate (*)    All other components within normal limits    EKG   Radiology No  results found.  Procedures Procedures (including critical care time)  Medications Ordered in UC Medications - No data to display  Initial Impression / Assessment and Plan / UC Course  I have reviewed the triage vital signs and the nursing notes.  Pertinent labs & imaging results that were available during my care of the patient were reviewed by me and considered in my medical decision making (see chart for details).  Moderate blood in urine With abrupt onset of pain, patient appearance and reporting 10/10 pain I have advised her to be evaluated in the ED. Needs higher level of care and imaging. Understands urgent care does not have the resources for appropriate work up. Sent to ED via POV  Final Clinical Impressions(s) / UC Diagnoses   Final diagnoses:  RLQ abdominal pain     Discharge Instructions      Please go to the emergency department  [facility]     ED Prescriptions   None    PDMP not reviewed this encounter.   Marlow Baars, [place] 12/14/22 1513

## 2022-12-14 NOTE — ED Notes (Signed)
Patient is being discharged from the Urgent Care and sent to the Emergency Department via POV. Per [employer], PA, patient is in need of higher level of care due to abdominal pain. Patient is aware and verbalizes understanding of plan of care.  Vitals:   12/14/22 1421  BP: 113/79  Pulse: 86  Resp: 18  Temp: 98.1 F (36.7 C)  SpO2: 96%

## 2022-12-14 NOTE — ED Provider Notes (Signed)
[place] EMERGENCY DEPARTMENT AT [facility] Provider Note   CSN: [id number] Arrival date & time: 12/14/22  1500     History  Chief Complaint  Patient presents with   Abdominal Pain    Anita Knight is a 42 y.o. female.  With PMH of HTN, GERD presenting with atraumatic right lower quadrant and right flank pain since this afternoon.  Patient woke up from sleep around this afternoon with stabbing right lower quadrant pain that radiates into her right flank.  She does note peeing more than often but denies any hematuria or dysuria.  She has had no fevers, no chills, no vomiting or diarrhea.  Bowel movements have been normal.  No history of abdominal surgery.  Does not get menstrual periods because she has Nexplanon.  Denies any vaginal burning itching or discharge.  No history of kidney stones, ovarian cysts.  She has not taken any medication for pain today. No CP or SOB.   Abdominal Pain      Home Medications Prior to Admission medications   Medication Sig Start Date End Date Taking? Authorizing Provider  ketorolac (TORADOL) 10 MG tablet Take 1 tablet (10 mg total) by mouth every 6 (six) hours as needed for moderate pain or severe pain. 12/14/22  Yes Mardene Sayer, MD  ondansetron (ZOFRAN-ODT) 4 MG disintegrating tablet Take 1 tablet (4 mg total) by mouth every 8 (eight) hours as needed. 12/14/22  Yes Mardene Sayer, MD  tamsulosin (FLOMAX) 0.4 MG CAPS capsule Take 1 capsule (0.4 mg total) by mouth daily after supper for 14 days. 12/14/22 12/28/22 Yes Mardene Sayer, MD  amLODipine (NORVASC) 10 MG tablet Take 10 mg by mouth daily.    [provider]  HYDROcodone-acetaminophen (NORCO) 5-325 MG tablet Take 1 tablet by mouth every 6 (six) hours as needed for moderate pain. 09/08/22   Candelaria Stagers, DPM  ibuprofen (ADVIL) 800 MG tablet Take 1 tablet (800 mg total) by mouth every 6 (six) hours as needed. 08/29/22   Candelaria Stagers, DPM   oxyCODONE-acetaminophen (PERCOCET) 5-325 MG tablet Take 1 tablet by mouth every 4 (four) hours as needed for severe pain. Patient not taking: Reported on 09/08/2022 08/29/22   Candelaria Stagers, DPM  pantoprazole (PROTONIX) 20 MG tablet Take 20 mg by mouth daily.    [provider]      Allergies    Hydrocodone    Review of Systems   Review of Systems  Gastrointestinal:  Positive for abdominal pain.    Physical Exam Updated Vital Signs BP (!) 151/97 (BP Location: Left Arm)   Pulse 87   Temp 98.5 F (36.9 C) (Oral)   Resp 19   Ht 5\' 5"  (1.651 m)   Wt 123 kg   SpO2 99%   BMI 45.12 kg/m  Physical Exam Constitutional: Alert and oriented.  Uncomfortable but nontoxic. Eyes: Conjunctivae are normal. ENT      Head: Normocephalic and atraumatic. Cardiovascular: S1, S2, regular rate Respiratory: Normal respiratory effort. Breath sounds are normal.  O2 sat 99 on RA Gastrointestinal: Soft and nondistended with right lower quadrant and right flank tenderness, no rebound or guarding, not peritonitic Musculoskeletal: Normal range of motion in all extremities. Neurologic: Normal speech and language.  GCS 15.  AOx4. Skin: Skin is warm, dry and intact. No rash noted. Psychiatric: Mood and affect are normal. Speech and behavior are normal.  ED Results / Procedures / Treatments   Labs (all labs ordered are  listed, but only abnormal results are displayed) Labs Reviewed  COMPREHENSIVE METABOLIC PANEL - Abnormal; Notable for the following components:      Result Value   Glucose, Bld 113 (*)    Creatinine, Ser 1.13 (*)    All other components within normal limits  URINALYSIS, W/ REFLEX TO CULTURE (INFECTION SUSPECTED) - Abnormal; Notable for the following components:   Hgb urine dipstick SMALL (*)    Bacteria, UA RARE (*)    All other components within normal limits  CBC WITH DIFFERENTIAL/PLATELET  LIPASE, BLOOD  HCG, QUANTITATIVE, PREGNANCY  I-STAT BETA HCG BLOOD, ED ([facility], [facility], [facility]  ONLY)    EKG None  Radiology CT ABDOMEN PELVIS W CONTRAST  Result Date: 12/14/2022 CLINICAL DATA:  RLQ pain, R flank pain EXAM: CT ABDOMEN AND PELVIS WITH CONTRAST TECHNIQUE: Multidetector CT imaging of the abdomen and pelvis was performed using the standard protocol following bolus administration of intravenous contrast. RADIATION DOSE REDUCTION: This exam was performed according to the departmental dose-optimization program which includes automated exposure control, adjustment of the mA and/or kV according to patient size and/or use of iterative reconstruction technique. CONTRAST:  OMNIPAQUE IOHEXOL 300 MG/ML  SOLN COMPARISON:  None Available. FINDINGS: Lower chest: Lung bases are clear. Hepatobiliary: Liver has a normal contour. No focal liver lesions are visualized. No evidence of intra or extrahepatic biliary ductal dilatation. Portal vein is contrast opacified. No evidence of cholelithiasis or cholecystitis. Incidentally noted is a contrast-enhancing lesion in the left hepatic lobe measuring 1.2 x 1.0 cm Pancreas: Unremarkable. No pancreatic ductal dilatation or surrounding inflammatory changes. Spleen: Normal in size without focal abnormality. Adrenals/Urinary Tract: Bilateral adrenal glands are normal in size. There is moderate right-sided hydronephrosis secondary to a 3 mm obstructing stone at the right UVJ. There are 2 nonobstructing left-sided renal stones at the lower pole of the left kidney. The urinary bladder is fluid-filled without focal abnormality. Stomach/Bowel: No evidence of bowel obstruction. The appendix is normal in appearance. No evidence of focal wall thickening. Vascular/Lymphatic: No significant vascular findings are present. No enlarged abdominal or pelvic lymph nodes. Reproductive: Uterus and bilateral adnexa are unremarkable. Other: No abdominal wall hernia or abnormality. No abdominopelvic ascites. Musculoskeletal: No acute or significant osseous findings. IMPRESSION:  1. Moderate right-sided hydronephrosis secondary to a 3 mm obstructing stone at the right UVJ. 2. Nonobstructing left-sided renal stones. 3. Incidentally noted is a contrast-enhancing lesion in the left hepatic lobe measuring 1.2 x 1.0 cm. This most likely represents a flash filling hemangioma, but is incompletely characterized. Recommend comparison with prior imaging. In the absence of prior imaging, recommend further evaluation with nonemergent liver protocol MRI with and without contrast. Electronically Signed   By: Lorenza Cambridge M.D.   On: 12/14/2022 16:40    Procedures Procedures    Medications Ordered in ED Medications  tamsulosin (FLOMAX) capsule 0.4 mg (has no administration in time range)  ketorolac (TORADOL) 30 MG/ML injection 30 mg (30 mg Intravenous Given 12/14/22 1549)  ondansetron (ZOFRAN) injection 4 mg (4 mg Intravenous Given 12/14/22 1550)  iohexol (OMNIPAQUE) 300 MG/ML solution 100 mL (100 mLs Intravenous Contrast Given 12/14/22 1625)    ED Course/ Medical Decision Making/ A&P Clinical Course as of 12/14/22 1736  Wed Dec 14, 2022  1714 Reassessed patient, she has had improvement of pain but still has some pain present especially with movement.  I offered oxycodone or hydrocodone or opioid but she says she has a lot of itching with this medication and would like  to hold off especially since she wants to drive home.  Will discharge with Toradol, Zofran and Flomax and referral to urology for outpatient urology follow-up for kidney stone.  Advise follow-up with PCP/GI for MRI of liver lesion. Return precautions discussed. [VB]    Clinical Course User Index [VB] Mardene Sayer, MD                             Medical Decision Making KALEIYAH CAULEY is a 42 y.o. female.  With PMH of HTN, GERD presenting with atraumatic right lower quadrant and right flank pain since this afternoon.   Based on the patient's right lower quadrant and flank pain, differential includes but  is not limited to appendicitis, UTI, GU ( women- ovarian torsion, ectopic, cyst rupture)  nephrolithiasis or pyelonephritis among multiple other etiologies.  Unlikely pulmonary source with no cardiopulmonary complaints, no hemodynamic instability, no hypoxia, no increased work of breathing and no adventitious breath sounds.  Labs reviewed by me generally unremarkable and reassuring.  Normal white blood cell count 8.6.  Creatinine 1.13 slightly elevated.  GFR still greater than 60.  No acute electrolyte abnormalities.  Patient not pregnant.  UA with small hemoglobin and 11-20 RBCs.  No nitrate, no leukocyte Estrace and 0-5 WBCs with rare bacteria, not consistent with infection/UTI or infected stone.  CTAP with contrast obtained which I personally reviewed showing evidence of hydronephrosis on the right with 3 millimeter obstructing stone at right UVJ.  Incidental finding of left hepatic lobe lesion possible hemangioma.  Reassessed patient, she has had improvement of pain but still has some pain present especially with movement.  I offered oxycodone or hydrocodone or opioid but she says she has a lot of itching with this medication and would like to hold off especially since she wants to drive home.  Will discharge with Toradol, Zofran and Flomax and referral to urology for outpatient urology follow-up for kidney stone.  Advise follow-up with PCP/GI for MRI of liver lesion. Return precautions discussed. [VB]  ---      Amount and/or Complexity of Data Reviewed Labs: ordered. Radiology: ordered.  Risk Prescription drug management.    Final Clinical Impression(s) / ED Diagnoses Final diagnoses:  Kidney stone  Liver lesion    Rx / DC Orders ED Discharge Orders          Ordered    ketorolac (TORADOL) 10 MG tablet  Every 6 hours PRN        12/14/22 1716    ondansetron (ZOFRAN-ODT) 4 MG disintegrating tablet  Every 8 hours PRN        12/14/22 1716    tamsulosin (FLOMAX) 0.4 MG CAPS  capsule  Daily after supper        12/14/22 1716    Ambulatory referral to Urology        12/14/22 1722              Mardene Sayer, MD 12/14/22 1736

## 2022-12-14 NOTE — Discharge Instructions (Signed)
Please go to the emergency department  [facility] EMERGENCY DEPARTMENT

## 2022-12-14 NOTE — ED Triage Notes (Signed)
Patient here today with c/o RLQ abd pain that started this morning. She has an urge to urinate but does not need to urinate. Pain radiates around to the LB.

## 2022-12-14 NOTE — Discharge Instructions (Addendum)
You have been seen in the Emergency Department (ED)  today for pain that is likely due to kidney stones.  As we have discussed, please drink plenty of fluids and use a urinary strainer to attempt to capture the stone if possible. If you are able to catch your stone, please keep it in the provided container and bring it to your urologist appointment. Please make a follow up appointment with Urology, ideally within the week, by calling the number below.   Please take your prescribed Flomax daily. This medication will help you pass the stone. Be aware that this medicine can sometimes lower your blood pressure, so be sure to go slow when you are getting up from a seated or lying position to standing as your lower blood pressure make make you feel lightheaded or even cause you to pass out if you get up too fast.  Please see your doctor or urologist (we have made a referral) as soon as possible, ideally within one week as stones may take 1-3 weeks to pass and you may require additional care or medications.  Return to the Emergency Department (ED)  or call your doctor if you have any worsening pain, fever, painful urination, are unable to urinate, or develop other symptoms that concern you.  Of note, you had an incidental finding of a small lesion on your liver that will need non-emergent follow-up with your primary care doctor.  You will have to get at some point an MRI of the abdomen with and without contrast to further follow this up.

## 2022-12-28 ENCOUNTER — Ambulatory Visit: Payer: Self-pay | Admitting: Urology

## 2023-01-25 ENCOUNTER — Encounter: Payer: Self-pay | Admitting: Urology

## 2023-01-25 ENCOUNTER — Ambulatory Visit: Payer: BC Managed Care – PPO | Admitting: Urology

## 2023-10-03 ENCOUNTER — Other Ambulatory Visit: Payer: Self-pay | Admitting: Family Medicine

## 2023-10-03 DIAGNOSIS — Z1231 Encounter for screening mammogram for malignant neoplasm of breast: Secondary | ICD-10-CM

## 2024-01-10 ENCOUNTER — Telehealth: Payer: Self-pay | Admitting: Podiatry

## 2024-01-10 NOTE — Telephone Encounter (Signed)
 Orthotics have been thrown away/ recycled 08/20/2022-08/21/2023 Balance: $157.52 Attempted to contact 2x
